# Patient Record
Sex: Male | Born: 1995 | Race: White | Hispanic: No | Marital: Single | Smoking: Never smoker
Health system: Southern US, Community
[De-identification: ages and names within clinical notes are randomized; demographics above are authoritative.]

## PROBLEM LIST (undated history)

## (undated) DIAGNOSIS — L709 Acne, unspecified: Principal | ICD-10-CM

## (undated) HISTORY — DX: Acne, unspecified: L70.9

## (undated) HISTORY — PX: SHOULDER SURGERY: SHX246

---
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
## 2013-03-03 ENCOUNTER — Ambulatory Visit (INDEPENDENT_AMBULATORY_CARE_PROVIDER_SITE_OTHER): Payer: PRIVATE HEALTH INSURANCE | Admitting: Family Medicine

## 2013-03-03 ENCOUNTER — Encounter: Payer: Self-pay | Admitting: Family Medicine

## 2013-03-03 VITALS — BP 126/84 | HR 87 | Temp 98.8°F | Ht 74.5 in | Wt 264.0 lb

## 2013-03-03 DIAGNOSIS — J069 Acute upper respiratory infection, unspecified: Secondary | ICD-10-CM

## 2013-03-03 MED ORDER — AMOXICILLIN 875 MG PO TABS: 875.0000 mg | ORAL_TABLET | Freq: Two times a day (BID) | ORAL | Status: AC

## 2013-03-03 NOTE — Patient Instructions (Addendum)
Use saline nasal spray 2-3 times per day to irrigate and moisturize your nasal passages.   Use "behind the counter" med like generic allegra D, generic zyrtec D, or generic claritin D. Continue ibuprofen 600mg  three times a day with food. Stop dayquil.

## 2013-03-03 NOTE — Assessment & Plan Note (Addendum)
Could be severe viral URI but could also be acute maxillary (bacterial) sinusitis. Will treat with amoxil 875mg  bid x 10d. Also discussed symptomatic care: Use saline nasal spray 2-3 times per day to irrigate and moisturize your nasal passages.   Use "behind the counter" med like generic allegra D, generic zyrtec D, or generic claritin D. Continue ibuprofen 600mg  three times a day with food. Stop dayquil.

## 2013-03-03 NOTE — Progress Notes (Signed)
OFFICE NOTE  03/03/2013  CC:  Chief Complaint  Patient presents with  . URI    sinus congestion, pressure since yesterday     HPI: Patient is a 17 y.o. Caucasian male who is here for respiratory complaints. Pt presents complaining of respiratory symptoms for 2  days.  Primary symptoms are: severe nasal congestion/runny nose, PND, LOTS of pressure on right side of face, "big" headache, no fever or significant coughing.  Worst symptoms seems to be the facial pressure.  Lately the symptoms seem to be worsening. Pertinent negatives: No fevers, no wheezing, and no SOB.  No pain in face or teeth.  No significant HA.  ST mild at most.   Symptoms made worse by nothing.  Symptoms improved by nothing.  Ibuprofen 800 mg helped relieve some head pressure.  Took dayquil yesterday but none today.   Smoker? no Recent sick contact? no Muscle or joint aches? no Flu shot this season at least 2 wks ago? no  Additional ROS: no n/v/d or abdominal pain.  No rash.  No neck stiffness.   +Mild fatigue.  +Mild appetite loss.   Pertinent PMH:  Past Medical History  Diagnosis Date  . Allergic rhinitis   . Acne 04/09/2012    MEDS:  Outpatient Prescriptions Prior to Visit  Medication Sig Dispense Refill  . cetirizine (ZYRTEC) 10 MG tablet Take 10 mg by mouth daily.      . Clindamycin-Benzoyl Per, Refr, gel Apply 5 mLs topically 2 (two) times daily.  45 g  1   No facility-administered medications prior to visit.    PE: Blood pressure 126/84, pulse 87, temperature 98.8 F (37.1 C), temperature source Temporal, height 6' 2.5" (1.892 m), weight 264 lb (119.75 kg), SpO2 95.00%. VS: noted--normal. Gen: alert, NAD, NONTOXIC APPEARING. HEENT: eyes without injection, drainage, or swelling.  Ears: EACs clear, TMs with normal light reflex and landmarks.  Nose: Clear rhinorrhea, with some dried, crusty exudate adherent to mildly injected mucosa.  No purulent d/c.  + right sided maxillary sinus TTP.  No facial  swelling.  Throat and mouth without focal lesion.  No pharyngial swelling, erythema, or exudate.  +thick, yellow PND noted on posterior pharyngial wall. Neck: supple, no LAD.   LUNGS: CTA bilat, nonlabored resps.   CV: RRR, no m/r/g. EXT: no c/c/e SKIN: no rash  LABS: none  IMPRESSION AND PLAN:  URI (upper respiratory infection) Could be severe viral URI but could also be acute maxillary (bacterial) sinusitis. Will treat with amoxil 875mg  bid x 10d. Also discussed symptomatic care: Use saline nasal spray 2-3 times per day to irrigate and moisturize your nasal passages.   Use "behind the counter" med like generic allegra D, generic zyrtec D, or generic claritin D. Continue ibuprofen 600mg  three times a day with food. Stop dayquil.    An After Visit Summary was printed and given to the patient.  FOLLOW UP: prn

## 2013-08-30 ENCOUNTER — Other Ambulatory Visit: Payer: Self-pay | Admitting: Family Medicine

## 2013-12-16 ENCOUNTER — Encounter (HOSPITAL_BASED_OUTPATIENT_CLINIC_OR_DEPARTMENT_OTHER): Payer: Self-pay | Admitting: Emergency Medicine

## 2013-12-16 ENCOUNTER — Emergency Department (HOSPITAL_BASED_OUTPATIENT_CLINIC_OR_DEPARTMENT_OTHER): Payer: PRIVATE HEALTH INSURANCE

## 2013-12-16 ENCOUNTER — Emergency Department (HOSPITAL_BASED_OUTPATIENT_CLINIC_OR_DEPARTMENT_OTHER)
Admission: EM | Admit: 2013-12-16 | Discharge: 2013-12-16 | Disposition: A | Discharge status: Home / Self Care | Payer: PRIVATE HEALTH INSURANCE | Attending: Emergency Medicine | Admitting: Emergency Medicine

## 2013-12-16 DIAGNOSIS — Y929 Unspecified place or not applicable: Secondary | ICD-10-CM | POA: Insufficient documentation

## 2013-12-16 DIAGNOSIS — S6710XA Crushing injury of unspecified finger(s), initial encounter: Secondary | ICD-10-CM | POA: Insufficient documentation

## 2013-12-16 DIAGNOSIS — Y9301 Activity, walking, marching and hiking: Secondary | ICD-10-CM | POA: Insufficient documentation

## 2013-12-16 DIAGNOSIS — S6721XA Crushing injury of right hand, initial encounter: Secondary | ICD-10-CM

## 2013-12-16 DIAGNOSIS — W230XXA Caught, crushed, jammed, or pinched between moving objects, initial encounter: Secondary | ICD-10-CM | POA: Insufficient documentation

## 2013-12-16 DIAGNOSIS — Z872 Personal history of diseases of the skin and subcutaneous tissue: Secondary | ICD-10-CM | POA: Insufficient documentation

## 2013-12-16 MED ORDER — OXYCODONE-ACETAMINOPHEN 5-325 MG PO TABS
ORAL_TABLET | ORAL | Status: AC
  Filled 2013-12-16: qty 1

## 2013-12-16 MED ORDER — OXYCODONE-ACETAMINOPHEN 5-325 MG PO TABS
1.0000 | ORAL_TABLET | Freq: Once | ORAL | Status: AC
  Administered 2013-12-16: 1 via ORAL

## 2013-12-16 MED ORDER — CEPHALEXIN 500 MG PO CAPS: 500.0000 mg | ORAL_CAPSULE | Freq: Four times a day (QID) | ORAL | Status: AC

## 2013-12-16 MED ORDER — OXYCODONE-ACETAMINOPHEN 5-325 MG PO TABS: 1.0000 | ORAL_TABLET | ORAL | Status: AC | PRN

## 2013-12-16 NOTE — ED Provider Notes (Signed)
CSN: [id number]     Arrival date & time 12/16/13  1801 History   First MD Initiated Contact with Patient 12/16/13 1829     Chief Complaint  Patient presents with  . Hand Injury   (Consider location/radiation/quality/duration/timing/severity/associated sxs/prior Treatment) Patient is a 17 y.o. male presenting with hand injury. The history is provided by the patient. No language interpreter was used.  Hand Injury Location:  Finger Finger location:  R index finger and R middle finger Associated symptoms: no fever   Associated symptoms comment:  He was walking while carrying a 45-pound weight round, tripped and the weight round landed on his right hand crushing distal 2nd and 3rd digits.    Past Medical History  Diagnosis Date  . Allergic rhinitis   . Acne 04/09/2012   Past Surgical History  Procedure Laterality Date  . Shoulder surgery Bilateral    Family History  Problem Relation Age of Onset  . Hypertension Mother   . Alcohol abuse Father   . Hypertension Father   . Heart disease Paternal Uncle   . Alcohol abuse Paternal Uncle   . Heart disease Paternal Grandfather    History  Substance Use Topics  . Smoking status: Never Smoker   . Smokeless tobacco: Never Used  . Alcohol Use: No    Review of Systems  Constitutional: Negative for fever.  Musculoskeletal:       See HPI.  Skin: Positive for wound.    Allergies  Review of patient's allergies indicates no known allergies.  Home Medications   Current Outpatient Rx  Name  Route  Sig  Dispense  Refill  . amoxicillin (AMOXIL) 875 MG tablet   Oral   Take 1 tablet (875 mg total) by mouth 2 (two) times daily.   20 tablet   0   . Clindamycin-Benzoyl Per, Refr, gel      APPLY TOPICALLY 2 TIMES A DAY   45 g   3    BP 142/81  Pulse 58  Temp(Src) 98.5 F (36.9 C) (Oral)  Resp 16  Ht 6\' 4"  (1.93 m)  Wt 270 lb (122.471 kg)  BMI 32.88 kg/m2  SpO2 96% Physical Exam  Constitutional: He is oriented to  person, place, and time. He appears well-developed and well-nourished. No distress.  Pulmonary/Chest: Effort normal.  Musculoskeletal:  Right hand injury to 2nd and 3rd fingers. Finger nails intact without subungual hematoma to either finger. Laceration to pad of each finger, index greater than 3rd finger. No active bleeding. Index finger laceration is large flap involving entire pad of distal finger. No bony exposure.  FROM all digits.   Neurological: He is alert and oriented to person, place, and time.  Skin:  See Musculo. Exam.    ED Course  Procedures (including critical care time) Labs Review Labs Reviewed - No data to display Imaging Review Dg Hand Complete Right  12/16/2013   CLINICAL DATA:  Dropped heavy object on hand. Right index and middle finger pain, bleeding.  EXAM: RIGHT HAND - COMPLETE 3+ VIEW  COMPARISON:  None.  FINDINGS: Soft tissue swelling at the tips of the right 2nd and 3rd digits. No underlying acute bony abnormality. No fracture, subluxation or dislocation.  IMPRESSION: No underlying bony abnormality.   Electronically Signed   By: Charlett Nose M.D.   On: 12/16/2013 18:28    EKG Interpretation   None       MDM  No diagnosis found. 1. Finger crush injury, right hand  LACERATION  REPAIR Performed by: Elpidio Anis A Authorized by: Elpidio Anis A Consent: Verbal consent obtained. Risks and benefits: risks, benefits and alternatives were discussed Consent given by: patient Patient identity confirmed: provided demographic data Prepped and Draped in normal sterile fashion Wound explored  Laceration Location: right index pad of finger  Laceration Length: 3cm  No Foreign Bodies seen or palpated  Anesthesia: local infiltration  Local anesthetic: lidocaine 2% w/o epinephrine  Anesthetic total: Digital block with 5 ml, local injection with 1 ml  Irrigation method: syringe Amount of cleaning: standard  Skin closure: 4-0 prolene  Number of sutures:  3, loose closure, tacking sutures  Technique: simple interrupted  Discussed with Dr. Annell Greening who advises loose closure, thorough cleansing and in-office follow up this week.   Patient tolerance: Patient tolerated the procedure well with no immediate complications.     Arnoldo Hooker, PA-C 12/16/13 2015

## 2013-12-16 NOTE — ED Notes (Signed)
Patient dropped a 45 pound weight on his R hand/ crush injury to index and middle finger/ bleeding. Tetanus within the last 10 years

## 2013-12-16 NOTE — ED Provider Notes (Signed)
Medical screening examination/treatment/procedure(s) were conducted as a shared visit with non-physician practitioner(s) and myself.  I personally evaluated the patient during the encounter.  EKG Interpretation   None        Ethelda Chick, MD 12/16/13 2030

## 2015-07-06 ENCOUNTER — Emergency Department (HOSPITAL_BASED_OUTPATIENT_CLINIC_OR_DEPARTMENT_OTHER): Payer: PRIVATE HEALTH INSURANCE

## 2015-07-06 ENCOUNTER — Emergency Department (HOSPITAL_BASED_OUTPATIENT_CLINIC_OR_DEPARTMENT_OTHER)
Admission: EM | Admit: 2015-07-06 | Discharge: 2015-07-07 | Disposition: A | Discharge status: Home / Self Care | Payer: PRIVATE HEALTH INSURANCE | Attending: Emergency Medicine | Admitting: Emergency Medicine

## 2015-07-06 ENCOUNTER — Encounter (HOSPITAL_BASED_OUTPATIENT_CLINIC_OR_DEPARTMENT_OTHER): Payer: Self-pay | Admitting: Emergency Medicine

## 2015-07-06 DIAGNOSIS — Y92310 Basketball court as the place of occurrence of the external cause: Secondary | ICD-10-CM | POA: Diagnosis not present

## 2015-07-06 DIAGNOSIS — Y998 Other external cause status: Secondary | ICD-10-CM | POA: Insufficient documentation

## 2015-07-06 DIAGNOSIS — Z79899 Other long term (current) drug therapy: Secondary | ICD-10-CM | POA: Diagnosis not present

## 2015-07-06 DIAGNOSIS — W2105XA Struck by basketball, initial encounter: Secondary | ICD-10-CM | POA: Insufficient documentation

## 2015-07-06 DIAGNOSIS — Z872 Personal history of diseases of the skin and subcutaneous tissue: Secondary | ICD-10-CM | POA: Diagnosis not present

## 2015-07-06 DIAGNOSIS — Y9367 Activity, basketball: Secondary | ICD-10-CM | POA: Diagnosis not present

## 2015-07-06 DIAGNOSIS — S4992XA Unspecified injury of left shoulder and upper arm, initial encounter: Secondary | ICD-10-CM | POA: Diagnosis present

## 2015-07-06 DIAGNOSIS — S43102A Unspecified dislocation of left acromioclavicular joint, initial encounter: Secondary | ICD-10-CM | POA: Insufficient documentation

## 2015-07-06 MED ORDER — IBUPROFEN 400 MG PO TABS
600.0000 mg | ORAL_TABLET | Freq: Once | ORAL | Status: AC
  Administered 2015-07-06: 600 mg via ORAL
  Filled 2015-07-06 (×2): qty 1

## 2015-07-06 NOTE — ED Notes (Signed)
Patient states that he was playing basketball and was hit in the left shoulder. Pain with movement

## 2015-07-06 NOTE — ED Provider Notes (Signed)
CSN: [id number]     Arrival date & time 07/06/15  2232 History  This chart was scribed for Loren Racer, MD by Phillis Haggis, ED Scribe. This patient was seen in room MH04/MH04 and patient care was started at 11:31 PM.     Chief Complaint  Patient presents with  . Shoulder Pain   The history is provided by the patient. No language interpreter was used.   HPI Comments: Travis Noble is a 19 y.o. male who presents to the Emergency Department complaining of left shoulder pain onset 2 hours ago. States that he was playing basketball when he was hit from behind, causing pain to the left shoulder area. States that he was unable to play after the injury occurred due to pain. Denies taking anything for the pain. Denies pain or injury to the left elbow area. No numbness or weakness.  Past Medical History  Diagnosis Date  . Allergic rhinitis   . Acne 04/09/2012   Past Surgical History  Procedure Laterality Date  . Shoulder surgery Bilateral    Family History  Problem Relation Age of Onset  . Hypertension Mother   . Alcohol abuse Father   . Hypertension Father   . Heart disease Paternal Uncle   . Alcohol abuse Paternal Uncle   . Heart disease Paternal Grandfather    History  Substance Use Topics  . Smoking status: Never Smoker   . Smokeless tobacco: Never Used  . Alcohol Use: No    Review of Systems  Constitutional: Negative for fever and chills.  Respiratory: Negative for cough and shortness of breath.   Cardiovascular: Negative for chest pain.  Musculoskeletal: Positive for arthralgias. Negative for neck pain.  Skin: Negative for rash and wound.  Neurological: Negative for dizziness, weakness and numbness.  All other systems reviewed and are negative.     Allergies  Review of patient's allergies indicates no known allergies.  Home Medications   Prior to Admission medications   Medication Sig Start Date End Date Taking? Authorizing Provider  amoxicillin (AMOXIL)  875 MG tablet Take 1 tablet (875 mg total) by mouth 2 (two) times daily. 03/03/13   Jeoffrey Massed, MD  cephALEXin (KEFLEX) 500 MG capsule Take 1 capsule (500 mg total) by mouth 4 (four) times daily. 12/16/13   Elpidio Anis, PA-C  Clindamycin-Benzoyl Per, Refr, gel APPLY TOPICALLY 2 TIMES A DAY 08/30/13   Jeoffrey Massed, MD  ibuprofen (ADVIL,MOTRIN) 600 MG tablet Take 1 tablet (600 mg total) by mouth every 6 (six) hours as needed. 07/07/15   Loren Racer, MD  oxyCODONE-acetaminophen (PERCOCET/ROXICET) 5-325 MG per tablet Take 1-2 tablets by mouth every 4 (four) hours as needed for severe pain. 12/16/13   Shari Upstill, PA-C   BP 115/56 mmHg  Pulse 52  Temp(Src) 98.5 F (36.9 C) (Oral)  Resp 18  Ht  (1.93 m)  Wt 225 lb (102.059 kg)  BMI 27.40 kg/m2  SpO2 99% Physical Exam  Constitutional: He is oriented to person, place, and time. He appears well-developed and well-nourished. No distress.  HENT:  Head: Normocephalic and atraumatic.  Eyes: EOM are normal. Pupils are equal, round, and reactive to light.  Neck: Normal range of motion. Neck supple.  Cardiovascular: Normal rate and regular rhythm.   Pulmonary/Chest: Effort normal and breath sounds normal.  Abdominal: Soft. Bowel sounds are normal.  Musculoskeletal: He exhibits tenderness. He exhibits no edema.  Patient has tenderness to palpation over the distal left clavicle. He has decreased  range of motion due to pain to the left shoulder. Full range of motion of the left elbow. Distal pulses intact.  Neurological: He is alert and oriented to person, place, and time.  5/5 grip strength bilaterally. Sensation intact.  Skin: Skin is warm and dry. No rash noted. No erythema.  Psychiatric: He has a normal mood and affect. His behavior is normal.  Nursing note and vitals reviewed.   ED Course  Procedures (including critical care time) COORDINATION OF CARE: 11:32 PM-Discussed treatment plan which includes x-ray and pain  medication with pt at bedside and pt agreed to plan.   Labs Review Labs Reviewed - No data to display  Imaging Review Dg Shoulder Left  07/06/2015   CLINICAL DATA:  19 year old male with trauma and left shoulder pain.  EXAM: LEFT SHOULDER - 2+ VIEW  COMPARISON:  None.  FINDINGS: There is no fracture or dislocation. There is widening of the acromioclavicular joint measuring approximately 12 mm. Clinical correlation is recommended to evaluate for [facility] injury. The soft tissues are unremarkable.  IMPRESSION: No acute fracture or dislocation.  Mild widening of the [facility] joint.  Clinical correlation is recommended.   Electronically Signed   By: Elgie CollardArash  Radparvar M.D.   On: 07/06/2015 23:41    EKG Interpretation None      MDM   Final diagnoses:  Acromioclavicular separation, left, initial encounter    I personally performed the services described in this documentation, which was scribed in my presence. The recorded information has been reviewed and is accurate.  Questional shoulder separation on x-ray. Clinical exam is consistent with this. Patient placed in shoulder immobilizer and will follow up with sports medicine. Return precautions given.   Loren Raceravid Tonie Vizcarrondo, MD 07/07/15 [phone]

## 2015-07-06 NOTE — ED Notes (Signed)
Left shoulder pain  Hit while playing basketball

## 2015-07-07 MED ORDER — IBUPROFEN 600 MG PO TABS: 600.0000 mg | ORAL_TABLET | Freq: Four times a day (QID) | ORAL | Status: AC | PRN

## 2015-07-07 NOTE — Discharge Instructions (Signed)
Shoulder Separation You have a shoulder separation (acromioclavicular separation). This occurs when an injury stretches or tears the ligaments that connect the top of the shoulder blade to the collar bone. Injury causes these bones to separate. A sling or splint may be applied to limit your range of motion. HOME CARE INSTRUCTIONS   Apply ice to the injury for 15-20 minutes each hour while awake for the first 2 days. Put the ice in a plastic bag. Place a towel between the bag of ice and your skin.  Avoid activities that increase pain.  Wear your sling or splint for as long as directed by your caregiver. If you remove the sling to dress or bathe, be sure your arm remains in the same position as when the sling is on. Do not lift your arm.  The splint must be tightened by another person every day. Tighten it enough to keep the shoulders held back. Allow enough room to place the index finger between the body and strap. Loosen the splint immediately if you feel numbness or tingling in your hands.  Only take over-the-counter or prescription medicines for pain, discomfort, or fever as directed by your caregiver.  If this is a severe injury, you may need a referral to a specialist. It is very important to keep any follow-up appointments in order to avoid any type of permanent shoulder disability or chronic pain problems. SEEK MEDICAL CARE IF:  Pain or swelling to the injury site increases and is not relieved with medications. SEEK IMMEDIATE MEDICAL CARE IF:  Your arm becomes numb, pale, cold, or there are abnormal feelings (sensations) shooting into your fingertips. Document Released: 09/13/2005 Document Revised: 04/20/2014 Document Reviewed: 07/16/2007 [employer] Patient Information 2015 [employer]. This information is not intended to replace advice given to you by your health care provider. Make sure you discuss any questions you have with your health care provider.  

## 2016-05-01 IMAGING — CR DG SHOULDER 2+V*L*
3 series · 3 of 3 positions shown · non-contrast
Comparison: None.

CLINICAL DATA: 18-year-old male with trauma and left shoulder pain.

EXAM:
LEFT SHOULDER - 2+ VIEW

[w shoulder ap internal left]
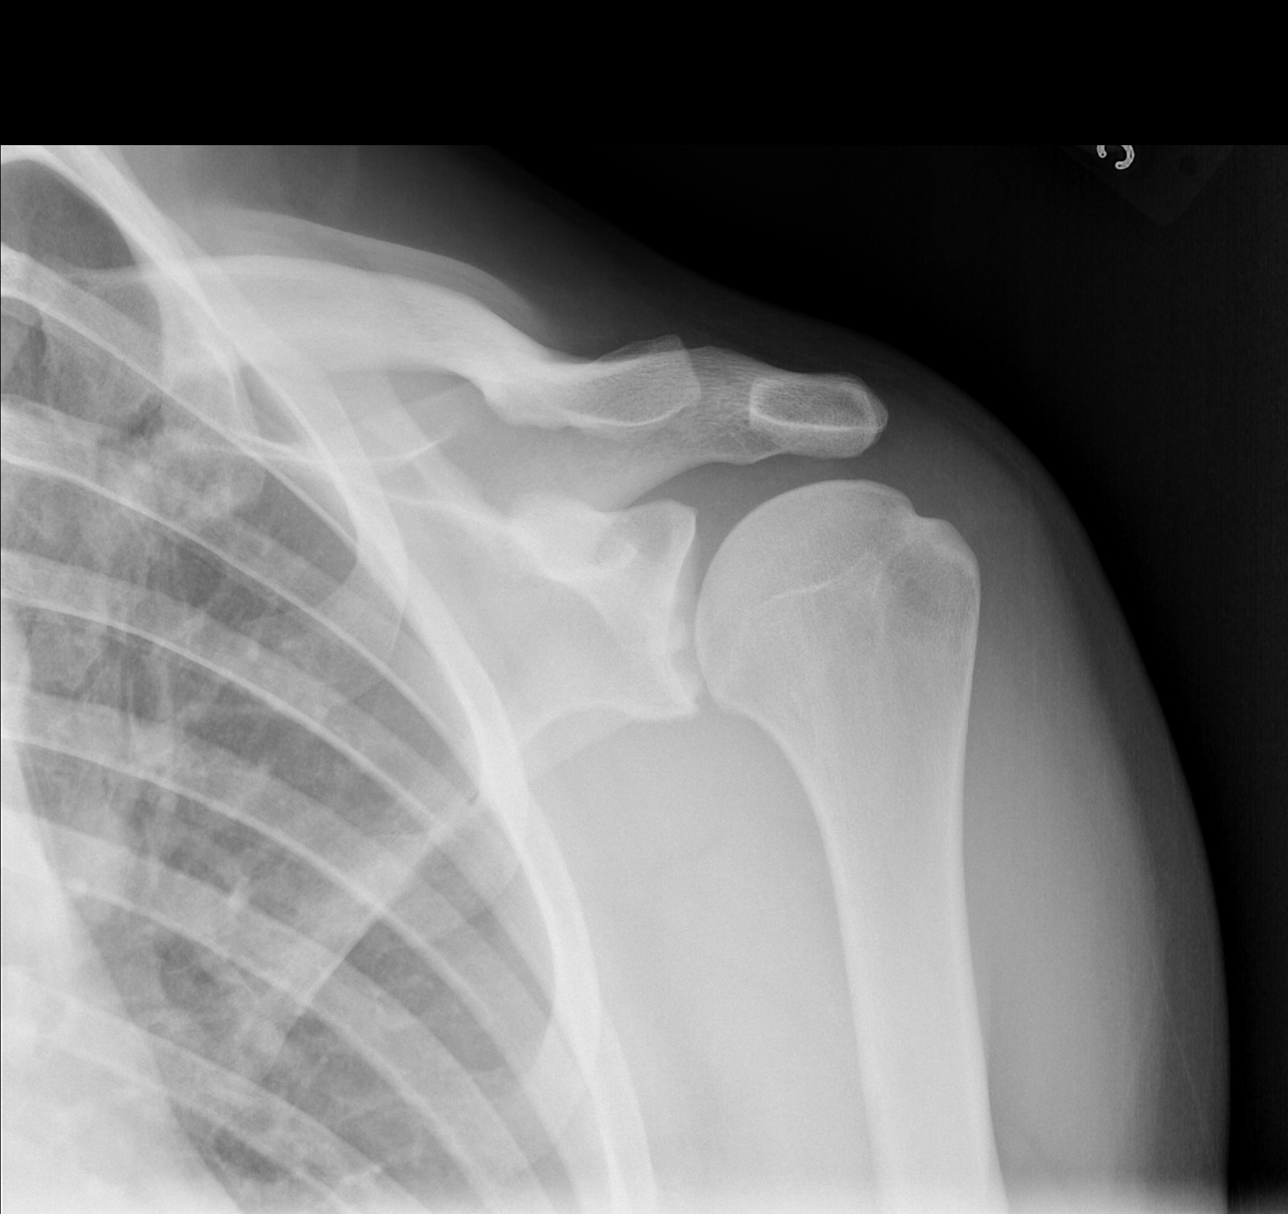

[w shoulder y view left]
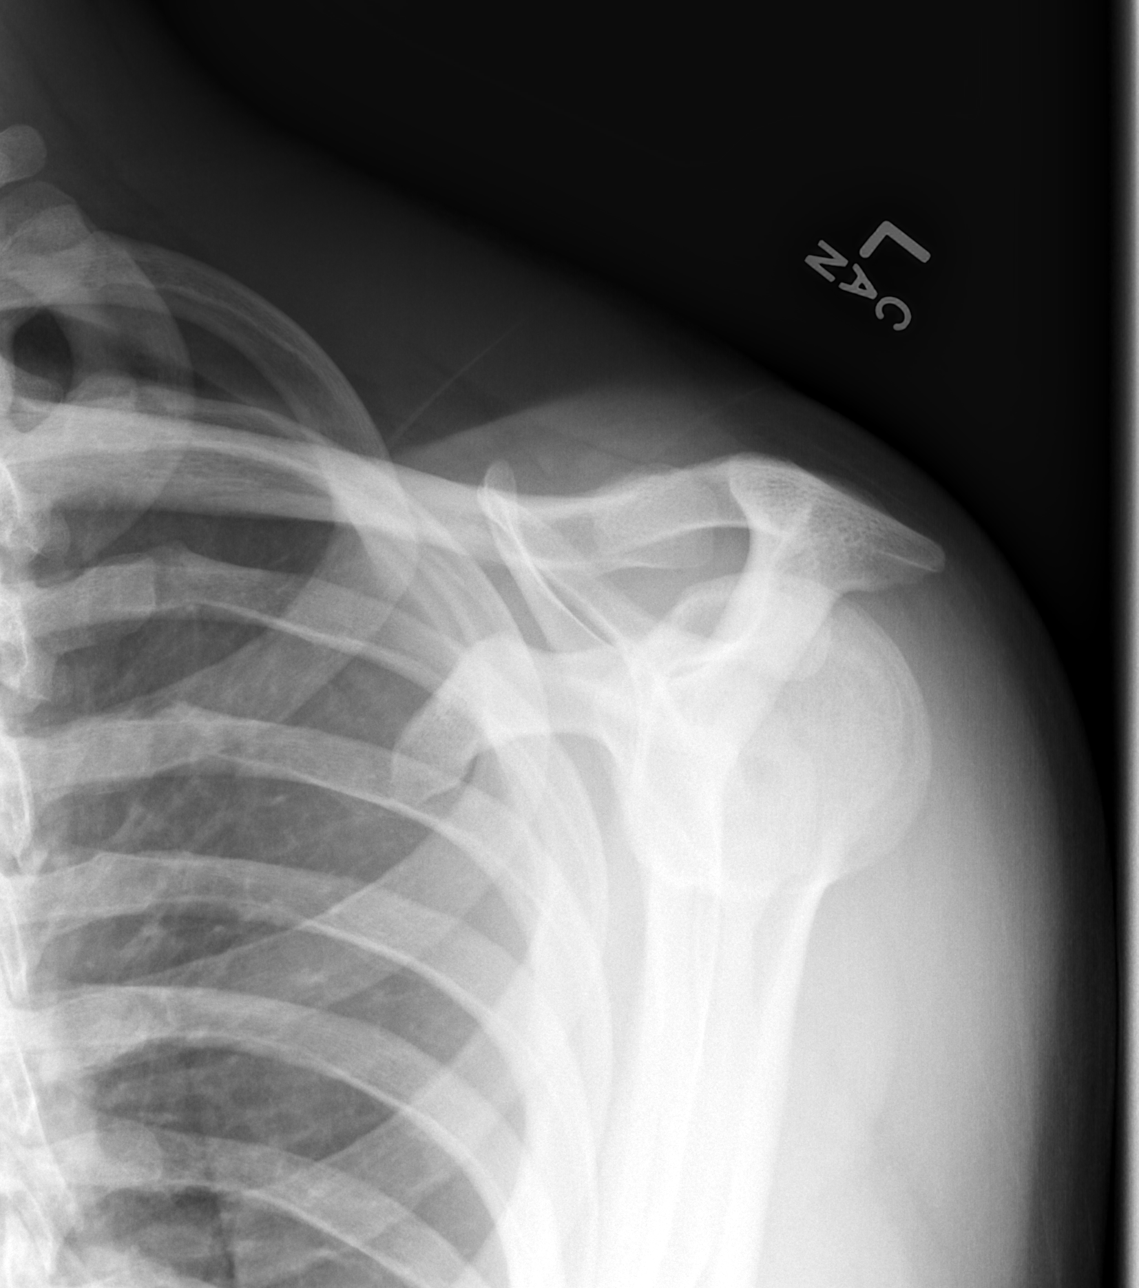

[x shoulder axillary left]
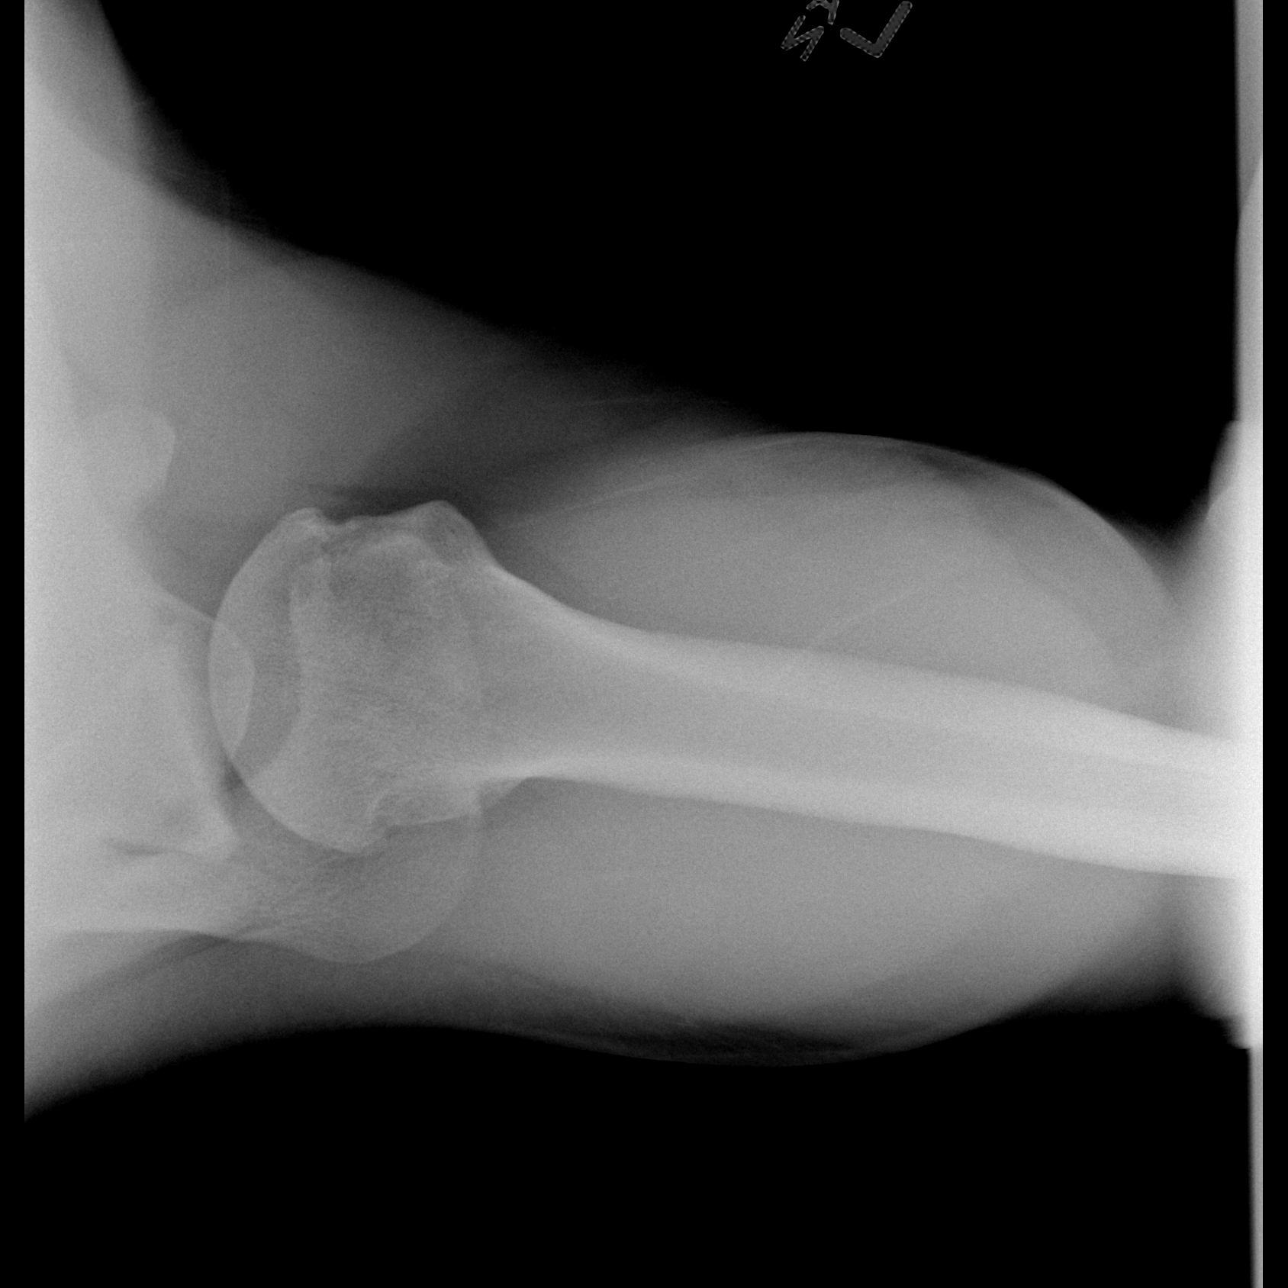

[3 of 3 positions shown; findings below may reference images not displayed]

FINDINGS: There is no fracture or dislocation. There is widening of the
acromioclavicular joint measuring approximately 12 mm. Clinical
correlation is recommended to evaluate for AC joints injury. The
soft tissues are unremarkable.
IMPRESSION: No acute fracture or dislocation.

Mild widening of the AC joint.  Clinical correlation is recommended.
